# Patient Record
Sex: Male | Born: 1975
Health system: Southern US, Community
[De-identification: ages and names within clinical notes are randomized; demographics above are authoritative.]

## PROBLEM LIST (undated history)

## (undated) DIAGNOSIS — D367 Benign neoplasm of other specified sites: Secondary | ICD-10-CM

## (undated) DIAGNOSIS — M1990 Unspecified osteoarthritis, unspecified site: Secondary | ICD-10-CM

## (undated) HISTORY — PX: WISDOM TOOTH EXTRACTION: SHX21

## (undated) HISTORY — PX: COLONOSCOPY: SHX174

---

## 2006-02-01 ENCOUNTER — Ambulatory Visit: Payer: Self-pay | Admitting: Internal Medicine

## 2006-02-08 ENCOUNTER — Ambulatory Visit: Payer: Self-pay | Admitting: Critical Care Medicine

## 2007-03-20 DIAGNOSIS — J45909 Unspecified asthma, uncomplicated: Secondary | ICD-10-CM | POA: Insufficient documentation

## 2007-03-21 ENCOUNTER — Ambulatory Visit: Payer: Self-pay | Admitting: Internal Medicine

## 2008-11-20 ENCOUNTER — Ambulatory Visit: Payer: Self-pay | Admitting: Internal Medicine

## 2009-01-22 ENCOUNTER — Telehealth (INDEPENDENT_AMBULATORY_CARE_PROVIDER_SITE_OTHER): Payer: Self-pay | Admitting: *Deleted

## 2010-07-02 NOTE — Assessment & Plan Note (Signed)
[facility]                             PULMONARY OFFICE NOTE   Danny Baker, Danny Baker                       MRN:          [id number]  DATE:02/01/2006                            DOB:          [birth year]    NEW PATIENT EVALUATION   HISTORY:  This is a 35 year old white male with passive smoke exposure  only and no previous history of any respiratory complaints. 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 He says he gets sick maybe a couple of times  a year with a cold that settles into his chest, but is gone within a  week without needing any form of therapy or visiting the doctor. He says  that when he underwent his recent set of PFTs, he was in the middle of a  typical cold. He feels completely better now. He states that he can do  pretty much anything he wants to in terms of exercise including  intense aerobics. This does not appear to be affected by weather,  environmental circumstances, including the most extreme (he has served  in [employer] in [place]).   Past medical history is significant for the absence of atopy or asthma.   ALLERGIES:  None known.   MEDICATIONS:  None regularly.   SOCIAL HISTORY:  He has never smoked, but was exposed passively to his  father. He has no unusual travel, pet or occupational exposure history.   FAMILY HISTORY:  Is significant for the absence of atopy or asthma.   REVIEW OF SYSTEMS:  Taken in detail on the worksheet and significantly  negative except as outlined above.   PHYSICAL EXAMINATION:  This is a pleasant, ambulatory, white male in no  acute distress. He has stable vital signs.  HEENT: Reveals no significant turbinate edema, pallor or polyps or  cyanosis. Ear canals are clear bilaterally.  NECK: Supple without cervical adenopathy or tenderness. Trachea is  midline. No thyromegaly.  Lung fields are completely clear bilaterally to auscultation  and  percussion with excellent air movement.  There is a regular rate and rhythm without murmur, gallop or rub.  ABDOMEN: Soft, benign.  EXTREMITIES: Warm without calf tenderness, cyanosis, clubbing or edema.   Chest x-ray was reviewed, dated December 08, 2005, and is normal. PFTs  revealed variable effort on previous studies and therefore were repeated  today indicating in the effort independent section of the flow volume  loop a definite concavity with an FEV1 to FEC ratio of 65% and an  absolute FEV1 of 76%. He had a 10% improvement after bronchodilators.   IMPRESSION:  This patient most likely has subclinical asthma that has  resulted in mild airway remodeling, but has absolutely no impact in  terms of his susceptibility to extremes of environment (as evidenced by  the fact that he could serve in [place] and do extreme aerobics without  difficulty) and even during what otherwise would be typical triggers,  such as upper respiratory infections, he does not even need to see the  doctor.  Therefore, I think it would be perfectly reasonable for the patient to  wear a respirator at work and would not restrict him from doing  anything. I would use standard precautions only, nothing special, in  view of these minimally abnormal PFTs.   The patient was interested in seeing if he could normalize his lung  functions and I think it is reasonable to try Symbicort 80/4.5 two puffs  twice daily for this purpose. If this normalizes his lung function then  he probably could stay on this and/or a more cost effective alternative  such as Qvar longterm, but again given the subclinical nature of his  asthma, it is a matter of treating the lung function tests, not the  patient in this particular regard.     Charlaine Dalton. Sherene Sires, MD, [facility]  Electronically Signed    MBW/MedQ  DD: 02/01/2006  DT: 02/01/2006  Job #: [id number]

## 2010-07-02 NOTE — Assessment & Plan Note (Signed)
[facility]                             PULMONARY OFFICE NOTE   Danny Baker, Danny Baker                       MRN:          [id number]  DATE:02/08/2006                            DOB:          1975-05-02    HISTORY OF PRESENT ILLNESS:  This is a 35 year old white male patient of  Dr. 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 Sherene Sires.  PFTs revealed an FEV1 to  FEV ratio of 65% and an absolute FEV1 of 76% with 10% improvement after  bronchodilators.  The patient was started on Symbicort 80/4.5 2 puffs  twice daily.  Returns today reporting that he is tolerating the  medication well.  Patient essentially had been asymptomatic without any  wheezing, shortness of breath, decreased exercise tolerance, or  nocturnal symptoms.   PAST MEDICAL HISTORY:  Reviewed.   CURRENT MEDICATIONS:  Reviewed.   PHYSICAL EXAMINATION:  GENERAL:  Patient is a pleasant male in no acute  distress.  VITAL SIGNS:  He is afebrile with stable vital signs.  HEENT:  Unremarkable.  LUNGS:  The lung sounds are clear.  CARDIAC:  Regular rate and rhythm.  ABDOMEN:  Soft, benign.  EXTREMITIES:  Warm without any edema.   REPEAT DATA:  Repeat spirometry today revealed an FEV1 with slight  improvement at 4 liters, which went from 76% to 83% of the predicted,  and mid flow is 3.12 liters from 43% up to 62% today.   IMPRESSION/PLAN:  Mild asthma with improvement on Symbicort.  Patient  will continue on his current regimen, follow back up with Dr. Sherene Sires in 2-  3 months or sooner if needed.  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      Rubye Oaks, NP  Electronically Signed      Charlaine Dalton. Sherene Sires, MD, [facility]  Electronically Signed   TP/MedQ  DD: 02/09/2006  DT: 02/09/2006  Job #: [id number]

## 2010-11-22 ENCOUNTER — Ambulatory Visit (INDEPENDENT_AMBULATORY_CARE_PROVIDER_SITE_OTHER): Payer: 59 | Admitting: Internal Medicine

## 2010-11-22 ENCOUNTER — Encounter: Payer: Self-pay | Admitting: Internal Medicine

## 2010-11-22 VITALS — BP 114/70 | HR 63 | Temp 98.5°F | Ht 72.0 in | Wt 195.6 lb

## 2010-11-22 DIAGNOSIS — J45909 Unspecified asthma, uncomplicated: Secondary | ICD-10-CM

## 2010-11-22 MED ORDER — BUDESONIDE-FORMOTEROL FUMARATE 80-4.5 MCG/ACT IN AERO: 2.0000 | INHALATION_SPRAY | Freq: Two times a day (BID) | RESPIRATORY_TRACT | Status: DC

## 2010-11-22 NOTE — Assessment & Plan Note (Signed)
PFT's today basically wnl so All goals of chronic asthma control met including optimal function and elimination of symptoms with minimal need for rescue therapy.  Contingencies discussed in full including contacting this office immediately if not controlling the symptoms using the rule of two's.

## 2010-11-22 NOTE — Progress Notes (Signed)
Subjective:     Patient ID: Danny Baker, male   DOB: Oct 26, 1975, 35 y.o.   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 He comes in now at the request that he be taken off of Symbicort because he's had no symptoms and does not need to take it regularly. However, note that he did not have any symptoms previously of airflow obst, namely cough or dyspnea, it was just a flunking PFTs. Rec maintain on symbicort 80 2 puffs first thing in am and 2 puffs again in pm about 12 hours later   November 20, 2008 Followup asthma. Pt states that breathing has been fine. Needs refill on symbicort, not using it consistently except in am, passed physical in January of 2010 and only here for refills which "ran out am of ov".  rec maintain on symbicort 80 2bid    11/22/2010 f/u ov/Zophia Marrone cc  Off symbicort for a year with excellent ex tolerance, no problems at work wearing respirator and no need for saba or otcs with sleep or exercise.  Sleeping ok without nocturnal  or early am exacerbation  of respiratory  c/o's or need for noct saba. Also denies any obvious fluctuation of symptoms with weather or environmental changes or other aggravating or alleviating factors except as outlined above  Pt denies any significant sore throat, dyphagia, itching, sneezing, nasal congestion or excess secretions or cough, fever, chills, sweats, unintended wt loss, pleuritic or exertional cp, change in activity tolerance orthopnea pnd or leg swelling..      Allergies No Known Drug Allergies      Past Medical History:  Asthma    Family History:  negative for atopy or asthma  dm in father who drinks heavily      Review of Systems     Objective:   Physical Exam Ambulatory healthy appearing in no acute distress.  Afeb with normal vital signs wt 197 > 199 November 20, 2008 >   11/22/2010  195  HEENT: nl dentition,mod ns turmbinate edema, and nl orophanx. Nl external ear canals without cough reflex  Neck without JVD/Nodes/TM  Lungs clear to A and P bilaterally without cough on insp or exp maneuvers  RRR no s3 or murmur or increase in P2  Abd soft and benign with nl excursion in the supine position. No bruits or organomegaly  Ext warm without calf tenderness, cyanosis clubbing or edema  Skin warm and dry without lesions     Assessment:         Plan:

## 2010-11-22 NOTE — Patient Instructions (Signed)
Work on Chemical engineer technique:  relax and gently blow all the way out then take a nice smooth deep breath back in, triggering the inhaler at same time you start breathing in.  Hold for up to 5 seconds if you can.  Rinse and gargle with water when done   If your mouth or throat starts to bother you,   I suggest you time the inhaler to your dental care and after using the inhaler(s) brush teeth and tongue with a baking soda containing toothpaste and when you rinse this out, gargle with it first to see if this helps your mouth and throat.     Symbicort 80 2 puffs every 12 hours for any respiratory symptoms at all

## 2012-09-04 ENCOUNTER — Emergency Department (HOSPITAL_COMMUNITY)
Admission: EM | Admit: 2012-09-04 | Discharge: 2012-09-05 | Disposition: A | Discharge status: Home / Self Care | Payer: Worker's Compensation | Attending: Emergency Medicine | Admitting: Emergency Medicine

## 2012-09-04 DIAGNOSIS — J45909 Unspecified asthma, uncomplicated: Secondary | ICD-10-CM | POA: Insufficient documentation

## 2012-09-04 DIAGNOSIS — IMO0002 Reserved for concepts with insufficient information to code with codable children: Secondary | ICD-10-CM | POA: Insufficient documentation

## 2012-09-04 DIAGNOSIS — S39012A Strain of muscle, fascia and tendon of lower back, initial encounter: Secondary | ICD-10-CM

## 2012-09-04 DIAGNOSIS — X58XXXA Exposure to other specified factors, initial encounter: Secondary | ICD-10-CM | POA: Insufficient documentation

## 2012-09-04 DIAGNOSIS — Y929 Unspecified place or not applicable: Secondary | ICD-10-CM | POA: Insufficient documentation

## 2012-09-04 DIAGNOSIS — Y939 Activity, unspecified: Secondary | ICD-10-CM | POA: Insufficient documentation

## 2012-09-04 DIAGNOSIS — S335XXA Sprain of ligaments of lumbar spine, initial encounter: Secondary | ICD-10-CM | POA: Insufficient documentation

## 2012-09-04 MED ORDER — LORAZEPAM 1 MG PO TABS
1.0000 mg | ORAL_TABLET | Freq: Once | ORAL | Status: AC
  Administered 2012-09-04: 1 mg via ORAL
  Filled 2012-09-04: qty 1

## 2012-09-04 MED ORDER — MORPHINE SULFATE 4 MG/ML IJ SOLN
4.0000 mg | Freq: Once | INTRAMUSCULAR | Status: AC
  Administered 2012-09-04: 4 mg via INTRAVENOUS
  Filled 2012-09-04: qty 1

## 2012-09-04 NOTE — ED Provider Notes (Signed)
   History    CSN: [id number] Arrival date & time 09/04/12  2326  First MD Initiated Contact with Patient 09/04/12 2332     Chief Complaint  Patient presents with  . Back Pain   (Consider location/radiation/quality/duration/timing/severity/associated sxs/prior Treatment) The history is provided by the patient.   37 year old male has been having some back pain during the course of the day and that he was in a closed space oriented someone in a somewhat awkward position and had sudden worsening of pain in his back. Pain goes across his lower back and into both hips. His also complaining of severe muscle spasm in his back. Pain is rated 10/10. There's no weakness or numbness or tingling. He has had mild back pain in the past but nothing severe like this. He was brought in by ambulance and given ketorolac and root with no relief. Past Medical History  Diagnosis Date  . Asthma    No past surgical history on file. No family history on file. History  Substance Use Topics  . Smoking status: Never Smoker   . Smokeless tobacco: Current User    Types: Snuff  . Alcohol Use: Yes     Comment: rare    Review of Systems  All other systems reviewed and are negative.    Allergies  Review of patient's allergies indicates no known allergies.  Home Medications   Current Outpatient Rx  Name  Route  Sig  Dispense  Refill  . EXPIRED: budesonide-formoterol (SYMBICORT) 80-4.5 MCG/ACT inhaler   Inhalation   Inhale 2 puffs into the lungs 2 (two) times daily.   1 Inhaler   12    BP 160/78  Pulse 68  Temp(Src) 98.1 F (36.7 C) (Oral)  Resp 18  SpO2 100% Physical Exam  Nursing note and vitals reviewed.  37 year old male, who appears to be in pain and is very uncomfortable, but his in no acute distress. Vital signs are significant for hypertension with blood pressure 160/78. Oxygen saturation is 100%, which is normal. Head is normocephalic and atraumatic. PERRLA, EOMI. Oropharynx is  clear. Neck is nontender and supple without adenopathy or JVD. Back is moderately tender throughout the lumbar area with marked bilateral paralumbar spasm. Straight leg raise is positive at 15 on the left and 30 on the right. Lungs are clear without rales, wheezes, or rhonchi. Chest is nontender. Heart has regular rate and rhythm without murmur. Abdomen is soft, flat, nontender without masses or hepatosplenomegaly and peristalsis is normoactive. Extremities have no cyanosis or edema, full range of motion is present. Skin is warm and dry without rash. Neurologic: Mental status is normal, cranial nerves are intact, there are no motor or sensory deficits.  ED Course  Procedures (including critical care time)  1. Lumbar strain, initial encounter     MDM  Acute lumbar strain with muscle spasm. He is alert he received NSAIDs so he will be given lorazepam as a muscle relaxer and morphine for pain. There is no indication for emergent imaging today.  He got good relief from muscle spasms with initial treatment but did require additional injections of morphine to get adequate relief of pain. He is discharged with prescriptions for oxycodone-acetaminophen, naproxen, and orphenadrine.  Dione Booze, MD 09/05/12 [phone]

## 2012-09-04 NOTE — ED Notes (Signed)
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 VSS BP 140/100, Hr 72, RR 20, 18 g LAC. Pt received Toradol 30 mg IVP en route, pain 10/10

## 2012-09-05 MED ORDER — MORPHINE SULFATE 4 MG/ML IJ SOLN
4.0000 mg | Freq: Once | INTRAMUSCULAR | Status: AC
  Administered 2012-09-05: 4 mg via INTRAVENOUS
  Filled 2012-09-05: qty 1

## 2012-09-05 MED ORDER — NAPROXEN 500 MG PO TABS: 500.0000 mg | ORAL_TABLET | Freq: Two times a day (BID) | ORAL | Status: AC

## 2012-09-05 MED ORDER — OXYCODONE-ACETAMINOPHEN 5-325 MG PO TABS: 1.0000 | ORAL_TABLET | ORAL | Status: DC | PRN

## 2012-09-05 MED ORDER — ORPHENADRINE CITRATE ER 100 MG PO TB12: 100.0000 mg | ORAL_TABLET | Freq: Two times a day (BID) | ORAL | Status: DC

## 2012-09-12 ENCOUNTER — Other Ambulatory Visit: Payer: Self-pay | Admitting: Family Medicine

## 2012-09-12 DIAGNOSIS — M545 Low back pain: Secondary | ICD-10-CM

## 2012-09-24 ENCOUNTER — Other Ambulatory Visit: Payer: Self-pay

## 2012-10-17 ENCOUNTER — Ambulatory Visit
Admission: RE | Admit: 2012-10-17 | Discharge: 2012-10-17 | Disposition: A | Discharge status: Home / Self Care | Payer: Worker's Compensation | Source: Ambulatory Visit | Attending: Family | Admitting: Family

## 2012-10-17 ENCOUNTER — Other Ambulatory Visit: Payer: Self-pay | Admitting: Family

## 2012-10-17 DIAGNOSIS — M545 Low back pain: Secondary | ICD-10-CM

## 2014-08-20 IMAGING — CR DG LUMBAR SPINE COMPLETE 4+V
5 series · 5 of 5 positions shown · non-contrast
Comparison: None.

CLINICAL DATA: Low back pain with radicular symptoms

LUMBAR SPINE - COMPLETE 4+ VIEW

[view not recorded (1 of 5)]
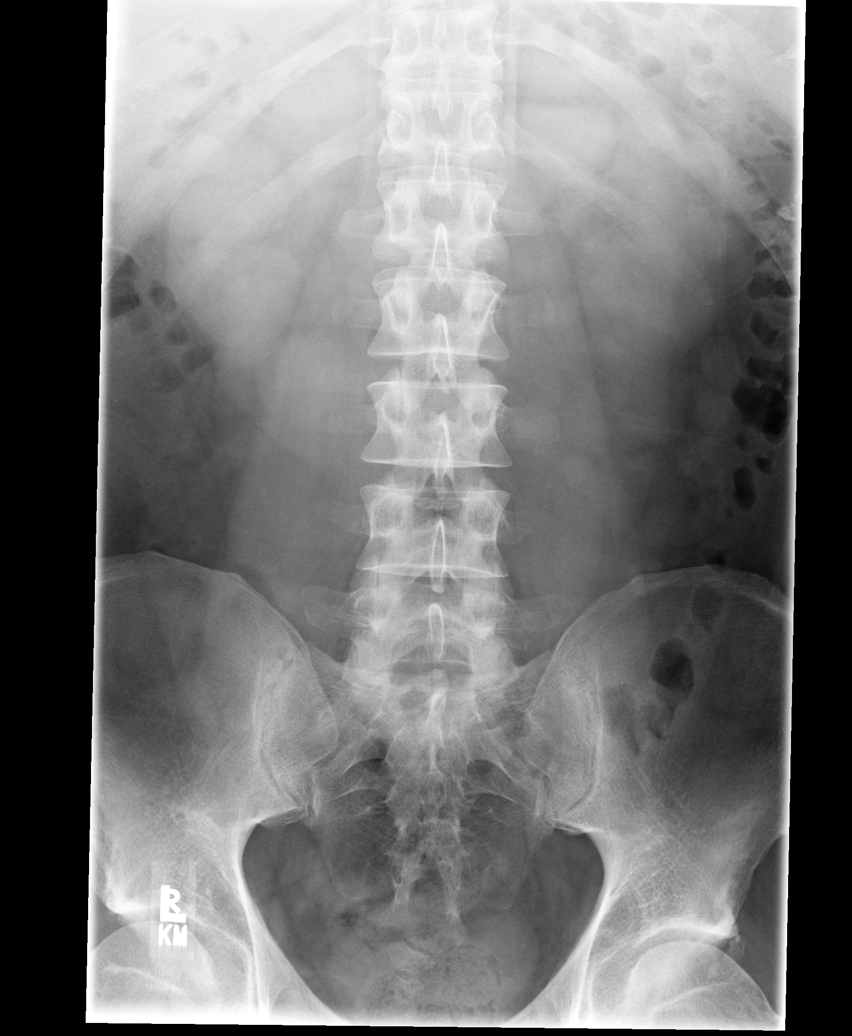

[view not recorded (2 of 5)]
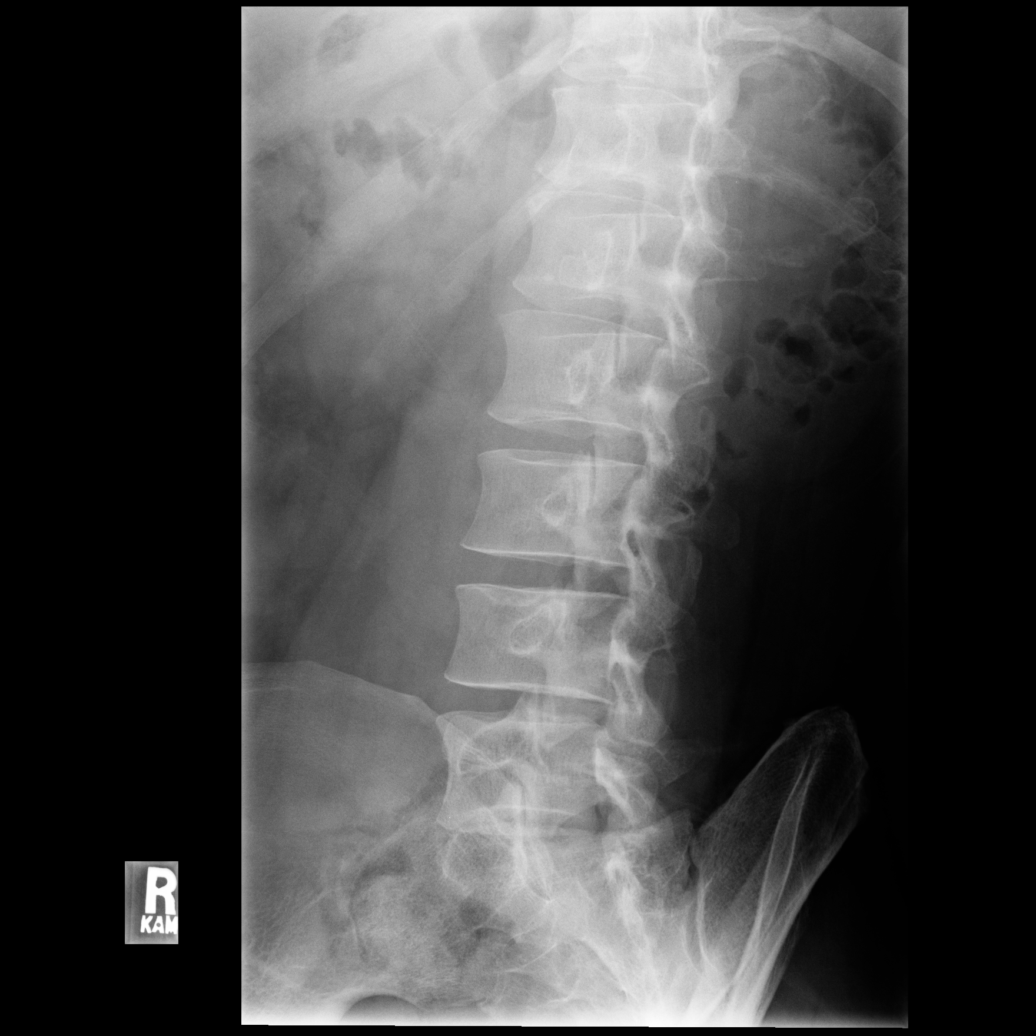

[view not recorded (3 of 5)]
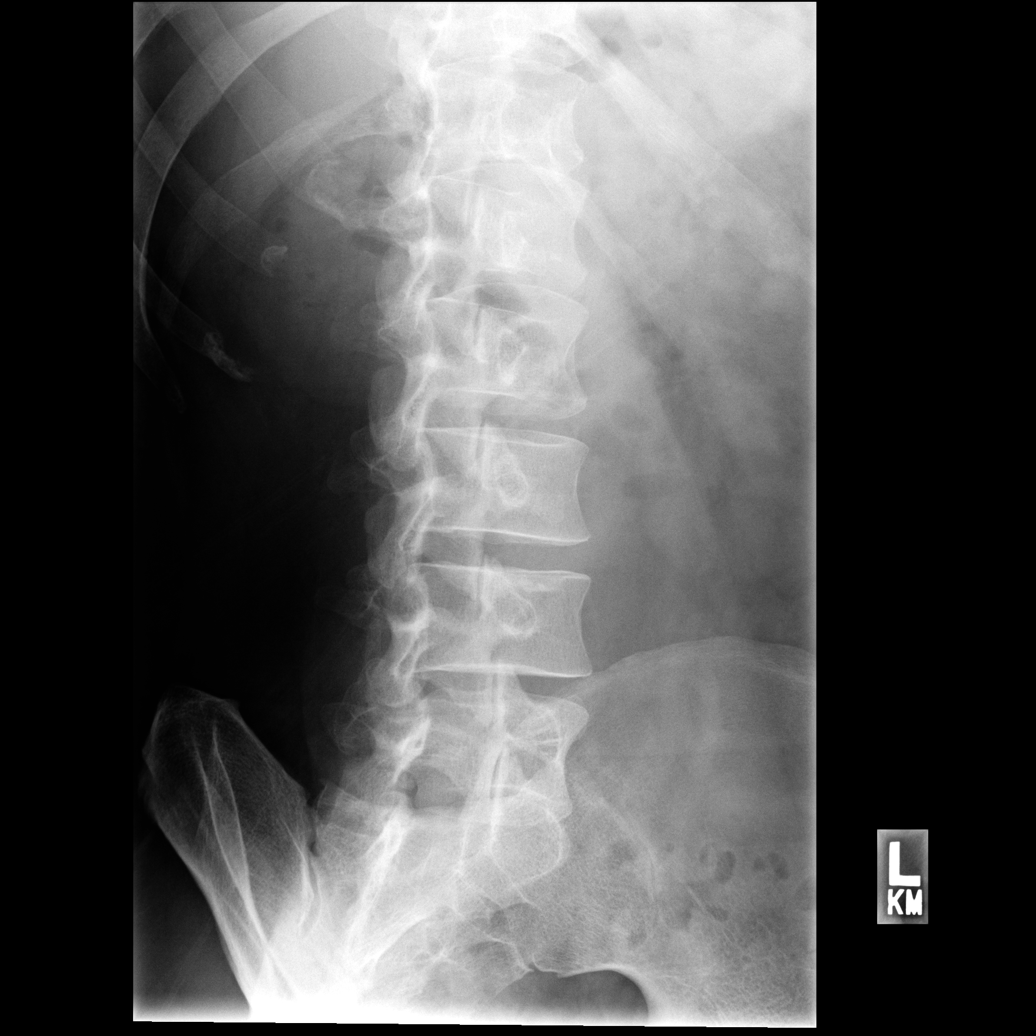

[view not recorded (4 of 5)]
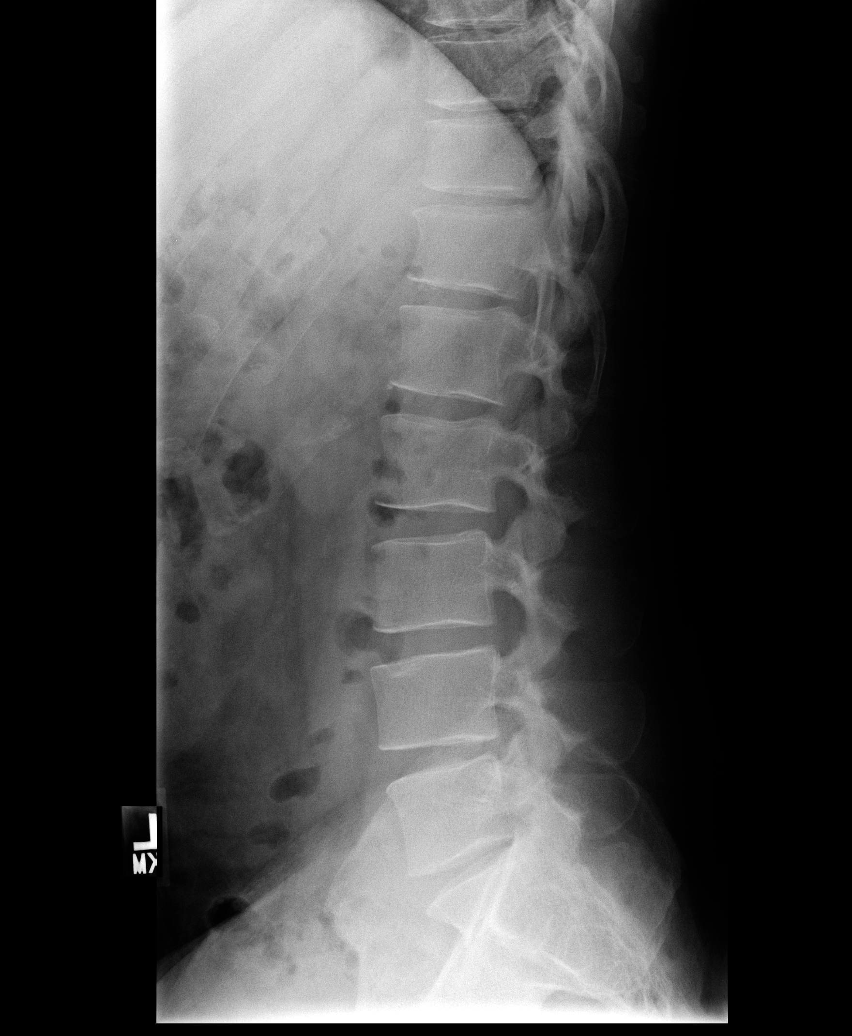

[view not recorded (5 of 5)]
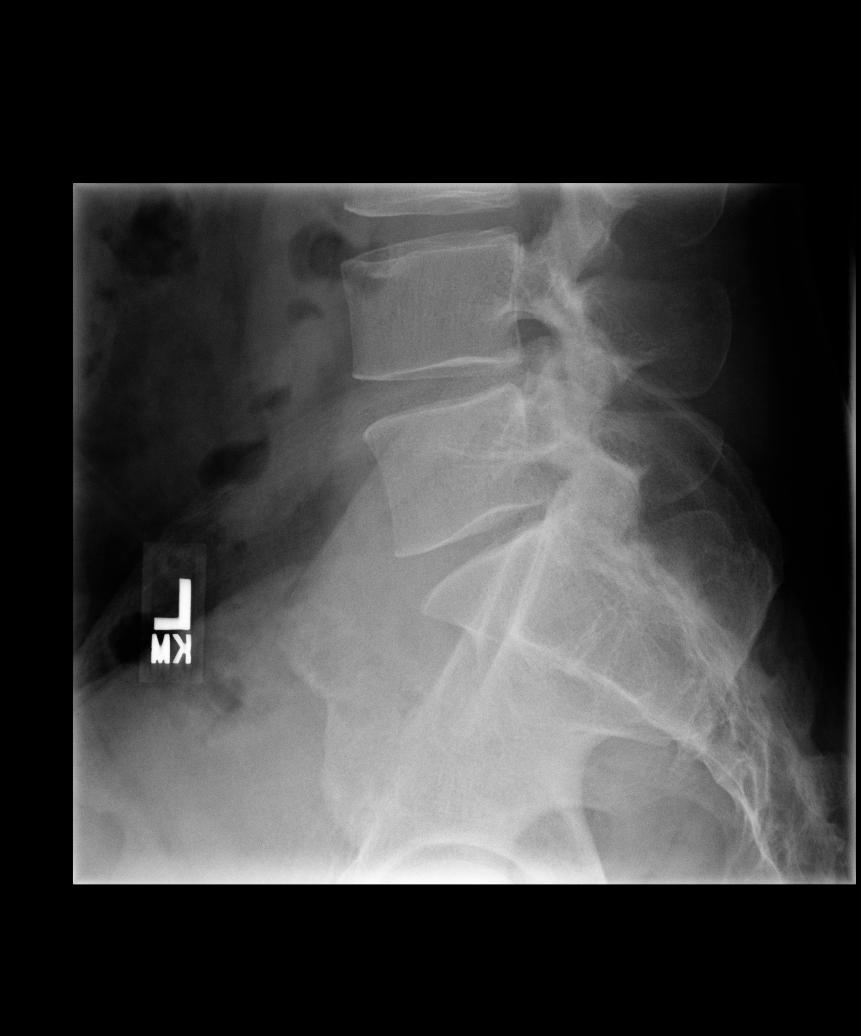

[5 of 5 positions shown; findings below may reference images not displayed]

FINDINGS: Frontal, lateral, spot lumbosacral lateral, and bilateral
oblique views were obtained.  There are five non-rib bearing lumbar
type vertebral bodies.  There is no fracture or spondylolisthesis.
There is slight disc space narrowing at L4-5.  Other disc spaces
appear normal.  There is mild facet osteoarthritic change at L5-S1
bilaterally.  No erosive change.
IMPRESSION: Slight osteoarthritic change.  No fracture or spondylolisthesis.

## 2016-08-18 DIAGNOSIS — R0602 Shortness of breath: Secondary | ICD-10-CM | POA: Diagnosis not present

## 2016-09-06 DIAGNOSIS — J392 Other diseases of pharynx: Secondary | ICD-10-CM | POA: Diagnosis not present

## 2016-12-19 DIAGNOSIS — Z800 Family history of malignant neoplasm of digestive organs: Secondary | ICD-10-CM | POA: Diagnosis not present

## 2016-12-19 DIAGNOSIS — M545 Low back pain: Secondary | ICD-10-CM | POA: Diagnosis not present

## 2016-12-19 DIAGNOSIS — Z131 Encounter for screening for diabetes mellitus: Secondary | ICD-10-CM | POA: Diagnosis not present

## 2016-12-19 DIAGNOSIS — Z136 Encounter for screening for cardiovascular disorders: Secondary | ICD-10-CM | POA: Diagnosis not present

## 2016-12-19 DIAGNOSIS — Z0000 Encounter for general adult medical examination without abnormal findings: Secondary | ICD-10-CM | POA: Diagnosis not present

## 2016-12-28 DIAGNOSIS — Z800 Family history of malignant neoplasm of digestive organs: Secondary | ICD-10-CM | POA: Diagnosis not present

## 2017-01-12 DIAGNOSIS — Z800 Family history of malignant neoplasm of digestive organs: Secondary | ICD-10-CM | POA: Diagnosis not present

## 2017-01-12 DIAGNOSIS — D126 Benign neoplasm of colon, unspecified: Secondary | ICD-10-CM | POA: Diagnosis not present

## 2017-01-12 DIAGNOSIS — Z1211 Encounter for screening for malignant neoplasm of colon: Secondary | ICD-10-CM | POA: Diagnosis not present

## 2017-06-21 DIAGNOSIS — E7800 Pure hypercholesterolemia, unspecified: Secondary | ICD-10-CM | POA: Diagnosis not present

## 2017-06-21 DIAGNOSIS — M792 Neuralgia and neuritis, unspecified: Secondary | ICD-10-CM | POA: Diagnosis not present

## 2017-06-21 DIAGNOSIS — L723 Sebaceous cyst: Secondary | ICD-10-CM | POA: Diagnosis not present

## 2017-07-04 ENCOUNTER — Other Ambulatory Visit: Payer: Self-pay | Admitting: Surgery

## 2017-07-04 DIAGNOSIS — L089 Local infection of the skin and subcutaneous tissue, unspecified: Secondary | ICD-10-CM | POA: Diagnosis not present

## 2017-07-04 DIAGNOSIS — L723 Sebaceous cyst: Secondary | ICD-10-CM | POA: Diagnosis not present

## 2017-08-04 ENCOUNTER — Other Ambulatory Visit: Payer: Self-pay | Admitting: Surgery

## 2017-08-04 DIAGNOSIS — L723 Sebaceous cyst: Secondary | ICD-10-CM | POA: Diagnosis not present

## 2017-08-04 DIAGNOSIS — L089 Local infection of the skin and subcutaneous tissue, unspecified: Secondary | ICD-10-CM | POA: Diagnosis not present

## 2017-08-09 ENCOUNTER — Encounter (HOSPITAL_BASED_OUTPATIENT_CLINIC_OR_DEPARTMENT_OTHER): Payer: Self-pay | Admitting: *Deleted

## 2017-08-14 ENCOUNTER — Other Ambulatory Visit: Payer: Self-pay

## 2017-08-14 ENCOUNTER — Encounter (HOSPITAL_BASED_OUTPATIENT_CLINIC_OR_DEPARTMENT_OTHER): Payer: Self-pay | Admitting: *Deleted

## 2017-08-15 NOTE — H&P (Signed)
  Danny Baker [name] Documented: 08/04/2017 11:14 AM Location: [facility] Patient #: [id number] DOB: Mar 12, 1975 Married / Language: Danny Baker / Race: White Male   History of Present Illness (Quantay Zaremba A. Ninfa Linden MD; 08/04/2017 11:39 AM) The patient is a 42 year old male who presents with skin lesions. He is here today for another evaluation of the chronically infected sebaceous cyst on his right posterior neck. He reports he still had intermittent drainage from the cyst for about a week and then finally quit with antibiotics. He still has some discomfort at the cyst site is otherwise doing well.   Allergies Malachi Bonds, CMA; 08/04/2017 11:14 AM) No Known Drug Allergies [07/04/2017]:  Medication History (Malachi Bonds, CMA; 08/04/2017 11:14 AM) Naproxen (500MG  Tablet, Oral) Active. Medications Reconciled Past Surgical History Malachi Bonds, CMA; 07/04/2017 4:22 PM) Colon Polyp Removal - Colonoscopy  Oral Surgery   Diagnostic Studies History Malachi Bonds, CMA; 07/04/2017 4:22 PM) Colonoscopy  within last year  Allergies Malachi Bonds, CMA; 07/04/2017 4:23 PM) No Known Drug Allergies [07/04/2017]:  Medication History Malachi Bonds, CMA; 07/04/2017 4:23 PM) Naproxen (500MG  Tablet, Oral) Active. Medications Reconciled  Social History Malachi Bonds, CMA; 07/04/2017 4:22 PM) Alcohol use  Occasional alcohol use. Caffeine use  Carbonated beverages, Tea. No drug use  Tobacco use  Never smoker.  Family History Malachi Bonds, CMA; 07/04/2017 4:22 PM) Colon Cancer  Mother. Diabetes Mellitus  Father. Hypertension  Father. Respiratory Condition  Father.  Other Problems Malachi Bonds, CMA; 07/04/2017 4:22 PM) Back Pain  Vitals (Chemira Jones CMA; 08/04/2017 11:14 AM) 08/04/2017 11:14 AM Weight: 187.2 lb Height: 72in Body Surface Area: 2.07 m Body Mass Index: 25.39 kg/m  Temp.: 97.41F(Oral)  Pulse: 98 (Regular)  BP: 130/82 (Sitting, Left Arm,  Standard)       Physical Exam (Rishab Stoudt A. Ninfa Linden MD; 08/04/2017 11:39 AM) The physical exam findings are as follows: Note:On exam, he still has the 2 cm sebaceous cyst on the right posterior neck. There is no erythema but it is mildly tender    Assessment & Plan (Viveka Wilmeth A. Ninfa Linden MD; 08/04/2017 11:40 AM) INFECTED SEBACEOUS CYST (L72.3) Impression: At this point, complete excision of the chronically infected sebaceous cyst is recommended to prevent ongoing infections. I discussed this with him in detail. We discussed the risks of the procedure. We also discussed continued conservative management. He wishes to proceed with surgery. We discussed postoperative recovery. He understands and surgery will be scheduled Current Plans Restarted Doxycycline Hyclate 100 MG Oral Capsule, 1 (one) Capsule two times daily, 20 days starting 08/04/2017, No Refill.

## 2017-08-15 NOTE — Progress Notes (Signed)
Pt given ensure as well as instructions to drink by 1130 DOS. Marland Kitchen Pt verbalized understanding

## 2017-08-16 ENCOUNTER — Ambulatory Visit (HOSPITAL_BASED_OUTPATIENT_CLINIC_OR_DEPARTMENT_OTHER): Payer: 59 | Admitting: Certified Registered"

## 2017-08-16 ENCOUNTER — Encounter (HOSPITAL_BASED_OUTPATIENT_CLINIC_OR_DEPARTMENT_OTHER)
Admission: RE | Disposition: A | Discharge status: Home / Self Care | Payer: Self-pay | Source: Ambulatory Visit | Attending: Surgery

## 2017-08-16 ENCOUNTER — Other Ambulatory Visit: Payer: Self-pay

## 2017-08-16 ENCOUNTER — Ambulatory Visit (HOSPITAL_BASED_OUTPATIENT_CLINIC_OR_DEPARTMENT_OTHER)
Admission: RE | Admit: 2017-08-16 | Discharge: 2017-08-16 | Disposition: A | Discharge status: Home / Self Care | Payer: 59 | Source: Ambulatory Visit | Attending: Surgery | Admitting: Surgery

## 2017-08-16 DIAGNOSIS — L089 Local infection of the skin and subcutaneous tissue, unspecified: Secondary | ICD-10-CM | POA: Diagnosis not present

## 2017-08-16 DIAGNOSIS — L723 Sebaceous cyst: Secondary | ICD-10-CM | POA: Insufficient documentation

## 2017-08-16 DIAGNOSIS — J45909 Unspecified asthma, uncomplicated: Secondary | ICD-10-CM | POA: Diagnosis not present

## 2017-08-16 DIAGNOSIS — L0889 Other specified local infections of the skin and subcutaneous tissue: Secondary | ICD-10-CM | POA: Diagnosis not present

## 2017-08-16 HISTORY — DX: Unspecified osteoarthritis, unspecified site: M19.90

## 2017-08-16 HISTORY — DX: Benign neoplasm of other specified sites: D36.7

## 2017-08-16 HISTORY — PX: EXCISION MASS NECK: SHX6703

## 2017-08-16 SURGERY — EXCISION, MASS, NECK
Anesthesia: Monitor Anesthesia Care | Site: Neck | Laterality: Right

## 2017-08-16 MED ORDER — SCOPOLAMINE 1 MG/3DAYS TD PT72: 1.0000 | MEDICATED_PATCH | Freq: Once | TRANSDERMAL | Status: DC | PRN

## 2017-08-16 MED ORDER — CEFAZOLIN SODIUM-DEXTROSE 2-4 GM/100ML-% IV SOLN: 2.0000 g | INTRAVENOUS | Status: DC

## 2017-08-16 MED ORDER — ONDANSETRON HCL 4 MG/2ML IJ SOLN
INTRAMUSCULAR | Status: AC
  Filled 2017-08-16: qty 2

## 2017-08-16 MED ORDER — CELECOXIB 200 MG PO CAPS
200.0000 mg | ORAL_CAPSULE | ORAL | Status: AC
  Administered 2017-08-16: 200 mg via ORAL

## 2017-08-16 MED ORDER — GABAPENTIN 300 MG PO CAPS
300.0000 mg | ORAL_CAPSULE | ORAL | Status: AC
  Administered 2017-08-16: 300 mg via ORAL

## 2017-08-16 MED ORDER — PROPOFOL 500 MG/50ML IV EMUL
INTRAVENOUS | Status: DC | PRN
  Administered 2017-08-16: 200 ug/kg/min via INTRAVENOUS

## 2017-08-16 MED ORDER — FENTANYL CITRATE (PF) 100 MCG/2ML IJ SOLN
INTRAMUSCULAR | Status: AC
  Filled 2017-08-16: qty 2

## 2017-08-16 MED ORDER — CHLORHEXIDINE GLUCONATE CLOTH 2 % EX PADS: 6.0000 | MEDICATED_PAD | Freq: Once | CUTANEOUS | Status: DC

## 2017-08-16 MED ORDER — ACETAMINOPHEN 500 MG PO TABS
ORAL_TABLET | ORAL | Status: AC
  Filled 2017-08-16: qty 2

## 2017-08-16 MED ORDER — OXYCODONE HCL 5 MG PO TABS: 5.0000 mg | ORAL_TABLET | Freq: Four times a day (QID) | ORAL | 0 refills | Status: AC | PRN

## 2017-08-16 MED ORDER — FENTANYL CITRATE (PF) 100 MCG/2ML IJ SOLN
50.0000 ug | INTRAMUSCULAR | Status: AC | PRN
  Administered 2017-08-16: 50 ug via INTRAVENOUS
  Administered 2017-08-16 (×2): 25 ug via INTRAVENOUS

## 2017-08-16 MED ORDER — LIDOCAINE HCL (PF) 1 % IJ SOLN
INTRAMUSCULAR | Status: DC | PRN
  Administered 2017-08-16: 10 mL

## 2017-08-16 MED ORDER — ONDANSETRON HCL 4 MG/2ML IJ SOLN
INTRAMUSCULAR | Status: DC | PRN
  Administered 2017-08-16: 4 mg via INTRAVENOUS

## 2017-08-16 MED ORDER — MEPERIDINE HCL 25 MG/ML IJ SOLN: 6.2500 mg | INTRAMUSCULAR | Status: DC | PRN

## 2017-08-16 MED ORDER — KETOROLAC TROMETHAMINE 30 MG/ML IJ SOLN: 30.0000 mg | Freq: Once | INTRAMUSCULAR | Status: DC | PRN

## 2017-08-16 MED ORDER — MIDAZOLAM HCL 2 MG/2ML IJ SOLN
1.0000 mg | INTRAMUSCULAR | Status: DC | PRN
  Administered 2017-08-16: 2 mg via INTRAVENOUS

## 2017-08-16 MED ORDER — OXYCODONE HCL 5 MG PO TABS: 5.0000 mg | ORAL_TABLET | Freq: Once | ORAL | Status: DC | PRN

## 2017-08-16 MED ORDER — PROMETHAZINE HCL 25 MG/ML IJ SOLN: 6.2500 mg | INTRAMUSCULAR | Status: DC | PRN

## 2017-08-16 MED ORDER — GABAPENTIN 300 MG PO CAPS
ORAL_CAPSULE | ORAL | Status: AC
  Filled 2017-08-16: qty 1

## 2017-08-16 MED ORDER — FENTANYL CITRATE (PF) 100 MCG/2ML IJ SOLN: 25.0000 ug | INTRAMUSCULAR | Status: DC | PRN

## 2017-08-16 MED ORDER — LACTATED RINGERS IV SOLN
INTRAVENOUS | Status: DC
  Administered 2017-08-16: 14:00:00 via INTRAVENOUS

## 2017-08-16 MED ORDER — OXYCODONE HCL 5 MG/5ML PO SOLN: 5.0000 mg | Freq: Once | ORAL | Status: DC | PRN

## 2017-08-16 MED ORDER — DEXAMETHASONE SODIUM PHOSPHATE 10 MG/ML IJ SOLN
INTRAMUSCULAR | Status: AC
  Filled 2017-08-16: qty 1

## 2017-08-16 MED ORDER — PROPOFOL 10 MG/ML IV BOLUS
INTRAVENOUS | Status: AC
  Filled 2017-08-16: qty 20

## 2017-08-16 MED ORDER — MIDAZOLAM HCL 2 MG/2ML IJ SOLN
INTRAMUSCULAR | Status: AC
  Filled 2017-08-16: qty 2

## 2017-08-16 MED ORDER — CELECOXIB 200 MG PO CAPS
ORAL_CAPSULE | ORAL | Status: AC
  Filled 2017-08-16: qty 1

## 2017-08-16 MED ORDER — CEFAZOLIN SODIUM-DEXTROSE 2-3 GM-%(50ML) IV SOLR
INTRAVENOUS | Status: DC | PRN
  Administered 2017-08-16: 2 g via INTRAVENOUS

## 2017-08-16 MED ORDER — ACETAMINOPHEN 500 MG PO TABS
1000.0000 mg | ORAL_TABLET | ORAL | Status: AC
  Administered 2017-08-16: 1000 mg via ORAL

## 2017-08-16 SURGICAL SUPPLY — 45 items
ADH SKN CLS APL DERMABOND .7 (GAUZE/BANDAGES/DRESSINGS) ×1
BLADE CLIPPER SURG (BLADE) IMPLANT
BLADE HEX COATED 2.75 (ELECTRODE) ×3 IMPLANT
BLADE SURG 15 STRL LF DISP TIS (BLADE) ×1 IMPLANT
BLADE SURG 15 STRL SS (BLADE) ×3
CANISTER SUCT 1200ML W/VALVE (MISCELLANEOUS) IMPLANT
CHLORAPREP W/TINT 26ML (MISCELLANEOUS) ×3 IMPLANT
COVER BACK TABLE 60X90IN (DRAPES) ×3 IMPLANT
COVER MAYO STAND STRL (DRAPES) ×3 IMPLANT
DECANTER SPIKE VIAL GLASS SM (MISCELLANEOUS) IMPLANT
DERMABOND ADVANCED (GAUZE/BANDAGES/DRESSINGS) ×2
DERMABOND ADVANCED .7 DNX12 (GAUZE/BANDAGES/DRESSINGS) ×2 IMPLANT
DRAPE LAPAROTOMY 100X72 PEDS (DRAPES) ×3 IMPLANT
DRAPE UTILITY XL STRL (DRAPES) ×3 IMPLANT
ELECT REM PT RETURN 9FT ADLT (ELECTROSURGICAL) ×3
ELECTRODE REM PT RTRN 9FT ADLT (ELECTROSURGICAL) ×1 IMPLANT
GLOVE BIOGEL PI IND STRL 7.0 (GLOVE) IMPLANT
GLOVE BIOGEL PI INDICATOR 7.0 (GLOVE) ×2
GLOVE ECLIPSE 6.5 STRL STRAW (GLOVE) ×2 IMPLANT
GLOVE SURG SIGNA 7.5 PF LTX (GLOVE) ×3 IMPLANT
GOWN STRL REUS W/ TWL LRG LVL3 (GOWN DISPOSABLE) ×1 IMPLANT
GOWN STRL REUS W/ TWL XL LVL3 (GOWN DISPOSABLE) ×1 IMPLANT
GOWN STRL REUS W/TWL LRG LVL3 (GOWN DISPOSABLE) ×3
GOWN STRL REUS W/TWL XL LVL3 (GOWN DISPOSABLE) ×3
NDL HYPO 25X1 1.5 SAFETY (NEEDLE) ×1 IMPLANT
NEEDLE HYPO 25X1 1.5 SAFETY (NEEDLE) ×3 IMPLANT
NS IRRIG 1000ML POUR BTL (IV SOLUTION) IMPLANT
PACK BASIN DAY SURGERY FS (CUSTOM PROCEDURE TRAY) ×3 IMPLANT
PENCIL BUTTON HOLSTER BLD 10FT (ELECTRODE) ×3 IMPLANT
SLEEVE SCD COMPRESS KNEE MED (MISCELLANEOUS) IMPLANT
SPONGE LAP 4X18 RFD (DISPOSABLE) ×3 IMPLANT
SUT MNCRL AB 4-0 PS2 18 (SUTURE) IMPLANT
SUT VIC AB 2-0 SH 27 (SUTURE)
SUT VIC AB 2-0 SH 27XBRD (SUTURE) IMPLANT
SUT VIC AB 3-0 SH 27 (SUTURE) ×6
SUT VIC AB 3-0 SH 27X BRD (SUTURE) IMPLANT
SWAB COLLECTION DEVICE MRSA (MISCELLANEOUS) IMPLANT
SWAB CULTURE ESWAB REG 1ML (MISCELLANEOUS) IMPLANT
SYR BULB 3OZ (MISCELLANEOUS) IMPLANT
SYR CONTROL 10ML LL (SYRINGE) ×3 IMPLANT
TOWEL GREEN STERILE FF (TOWEL DISPOSABLE) ×3 IMPLANT
TOWEL OR NON WOVEN STRL DISP B (DISPOSABLE) ×3 IMPLANT
TUBE CONNECTING 20'X1/4 (TUBING) ×1
TUBE CONNECTING 20X1/4 (TUBING) ×1 IMPLANT
YANKAUER SUCT BULB TIP NO VENT (SUCTIONS) ×2 IMPLANT

## 2017-08-16 NOTE — Transfer of Care (Signed)
Immediate Anesthesia Transfer of Care Note  Patient: Danny Baker  Procedure(s) Performed: EXCISION OF RIGHT POSTERIOR NECK CHRONICALLY INFECTED CYST ERAS PATHWAY (Right Neck)  Patient Location: PACU  Anesthesia Type:MAC  Level of Consciousness: awake, alert  and oriented  Airway & Oxygen Therapy: Patient Spontanous Breathing and Patient connected to face mask oxygen  Post-op Assessment: Report given to RN and Post -op Vital signs reviewed and stable  Post vital signs: Reviewed and stable  Last Vitals:  Vitals Value Taken Time  BP 113/72 08/16/2017  2:41 PM  Temp    Pulse 56 08/16/2017  2:43 PM  Resp 17 08/16/2017  2:43 PM  SpO2 98 % 08/16/2017  2:43 PM  Vitals shown include unvalidated device data.  Last Pain:  Vitals:   08/16/17 1326  TempSrc: Oral         Complications: No apparent anesthesia complications

## 2017-08-16 NOTE — Interval H&P Note (Signed)
History and Physical Interval Note: no change in H and P  08/16/2017 1:12 PM  Danny Baker  has presented today for surgery, with the diagnosis of Chronically infected sebaceous cyst  The various methods of treatment have been discussed with the patient and family. After consideration of risks, benefits and other options for treatment, the patient has consented to  Procedure(s): EXCISION OF RIGHT POSTERIOR NECK CHRONICALLY INFECTED CYST ERAS PATHWAY (Right) as a surgical intervention .  The patient's history has been reviewed, patient examined, no change in status, stable for surgery.  I have reviewed the patient's chart and labs.  Questions were answered to the patient's satisfaction.     Danny Baker A

## 2017-08-16 NOTE — Op Note (Signed)
EXCISION OF RIGHT POSTERIOR NECK CHRONICALLY INFECTED CYST ERAS PATHWAY  Procedure Note  Danny Baker 08/16/2017   Pre-op Diagnosis: Chronically infected sebaceous cyst     Post-op Diagnosis: same  Procedure(s): Excision of chronically infected right posterior neck sebaceous cyst  ( 3 cm )  Surgeon(s): Coralie Keens, MD  Anesthesia: Choice  Staff:  Circulator: Antionette Poles, RN Scrub Person: Faythe Dingwall, RN  Estimated Blood Loss: Minimal               Specimens: sent to path  Procedure: The patient was brought to the operating room and identified as correct patient.  He was placed upon the operating table and anesthesia was induced.  We turned his head to the left.  We then prepped his posterior lateral neck on the right side with Betadine.  I anesthetized the skin over the visible cyst with lidocaine.  I then made an elliptical incision with Baker scalpel to excise the overlying skin.  I then took this down to the subcutaneous tissue with the scalpel.  There was Baker lot of inflammatory tissue and granulation tissue which I excised with the scalpel in the area.  This was all sent to pathology for evaluation.  I then achieved hemostasis with the cautery.  I then closed the incision in layers of 3-0 Vicryl suture and 4-0 Monocryl.  Dermabond was then applied.  The patient tolerated procedure well.  All the counts were correct at the end of procedure.  The patient was then taken in stable condition to the recovery room.          Danny Baker   Date: 08/16/2017  Time: 2:37 PM

## 2017-08-16 NOTE — Discharge Instructions (Signed)
°  Post Anesthesia Home Care Instructions  Activity: Get plenty of rest for the remainder of the day. A responsible individual must stay with you for 24 hours following the procedure.  For the next 24 hours, DO NOT: -Drive a car -Paediatric nurse -Drink alcoholic beverages -Take any medication unless instructed by your physician -Make any legal decisions or sign important papers.  Meals: Start with liquid foods such as gelatin or soup. Progress to regular foods as tolerated. Avoid greasy, spicy, heavy foods. If nausea and/or vomiting occur, drink only clear liquids until the nausea and/or vomiting subsides. Call your physician if vomiting continues.  Special Instructions/Symptoms: Your throat may feel dry or sore from the anesthesia or the breathing tube placed in your throat during surgery. If this causes discomfort, gargle with warm salt water. The discomfort should disappear within 24 hours.  If you had a scopolamine patch placed behind your ear for the management of post- operative nausea and/or vomiting:  1. The medication in the patch is effective for 72 hours, after which it should be removed.  Wrap patch in a tissue and discard in the trash. Wash hands thoroughly with soap and water. 2. You may remove the patch earlier than 72 hours if you experience unpleasant side effects which may include dry mouth, dizziness or visual disturbances. 3. Avoid touching the patch. Wash your hands with soap and water after contact with the patch.      Call your surgeon if you experience:   1.  Fever over 101.0. 2.  Inability to urinate. 3.  Nausea and/or vomiting. 4.  Extreme swelling or bruising at the surgical site. 5.  Continued bleeding from the incision. 6.  Increased pain, redness or drainage from the incision. 7.  Problems related to your pain medication. 8.  Any problems and/or concerns   Ok to shower starting tomorrow  Ice pack, Tylenol, ibuprofen also for pain  Try to avoid  vigorous activity for 1 week.

## 2017-08-16 NOTE — Anesthesia Preprocedure Evaluation (Signed)
Anesthesia Evaluation  Patient identified by MRN, date of birth, ID band Patient awake    Reviewed: Allergy & Precautions, NPO status , Patient's Chart, lab work & pertinent test results  Airway Mallampati: I  TM Distance: >3 FB Neck ROM: Full    Dental no notable dental hx.    Pulmonary neg pulmonary ROS,    Pulmonary exam normal breath sounds clear to auscultation       Cardiovascular negative cardio ROS Normal cardiovascular exam Rhythm:Regular Rate:Normal     Neuro/Psych negative neurological ROS  negative psych ROS   GI/Hepatic negative GI ROS, Neg liver ROS,   Endo/Other  negative endocrine ROS  Renal/GU negative Renal ROS     Musculoskeletal negative musculoskeletal ROS (+)   Abdominal   Peds  Hematology negative hematology ROS (+)   Anesthesia Other Findings   Reproductive/Obstetrics                             Anesthesia Physical Anesthesia Plan  ASA: II  Anesthesia Plan: MAC   Post-op Pain Management:    Induction: Intravenous  PONV Risk Score and Plan: Propofol infusion and Ondansetron  Airway Management Planned:   Additional Equipment:   Intra-op Plan:   Post-operative Plan:   Informed Consent: I have reviewed the patients History and Physical, chart, labs and discussed the procedure including the risks, benefits and alternatives for the proposed anesthesia with the patient or authorized representative who has indicated his/her understanding and acceptance.   Dental advisory given  Plan Discussed with: CRNA  Anesthesia Plan Comments:         Anesthesia Quick Evaluation

## 2017-08-18 ENCOUNTER — Encounter (HOSPITAL_BASED_OUTPATIENT_CLINIC_OR_DEPARTMENT_OTHER): Payer: Self-pay | Admitting: Surgery

## 2017-08-18 NOTE — Anesthesia Postprocedure Evaluation (Signed)
Anesthesia Post Note  Patient: Danny Baker  Procedure(s) Performed: EXCISION OF RIGHT POSTERIOR NECK CHRONICALLY INFECTED CYST ERAS PATHWAY (Right Neck)     Patient location during evaluation: PACU Anesthesia Type: MAC Level of consciousness: awake and alert Pain management: pain level controlled Vital Signs Assessment: post-procedure vital signs reviewed and stable Respiratory status: spontaneous breathing Cardiovascular status: stable Anesthetic complications: no    Last Vitals:  Vitals:   08/16/17 1500 08/16/17 1534  BP: 112/80 122/89  Pulse: (!) 46 (!) 50  Resp: 13 16  Temp:  36.4 C  SpO2: 99% 100%    Last Pain:  Vitals:   08/16/17 1534  TempSrc: Oral  PainSc: 0-No pain                 Nolon Nations

## 2017-12-22 DIAGNOSIS — Z13220 Encounter for screening for lipoid disorders: Secondary | ICD-10-CM | POA: Diagnosis not present

## 2017-12-22 DIAGNOSIS — Z0000 Encounter for general adult medical examination without abnormal findings: Secondary | ICD-10-CM | POA: Diagnosis not present

## 2020-07-03 ENCOUNTER — Other Ambulatory Visit: Payer: Self-pay | Admitting: Family Medicine

## 2020-07-03 DIAGNOSIS — M542 Cervicalgia: Secondary | ICD-10-CM

## 2020-07-10 ENCOUNTER — Ambulatory Visit
Admission: RE | Admit: 2020-07-10 | Discharge: 2020-07-10 | Disposition: A | Discharge status: Home / Self Care | Payer: 59 | Source: Ambulatory Visit | Attending: Family Medicine | Admitting: Family Medicine

## 2020-07-10 ENCOUNTER — Other Ambulatory Visit: Payer: Self-pay

## 2020-07-10 DIAGNOSIS — M542 Cervicalgia: Secondary | ICD-10-CM

## 2022-05-13 IMAGING — MR MR CERVICAL SPINE W/O CM
4 series · 43 of 48 positions shown · non-contrast
Comparison: None.

CLINICAL DATA: Neck pain radiating to left upper extremity.
Numbness and tingling in left arm. No known injury.

EXAM:
MRI CERVICAL SPINE WITHOUT CONTRAST
TECHNIQUE: Multiplanar, multisequence MR imaging of the cervical spine was
performed. No intravenous contrast was administered.

[Series 2: T2 · sagittal · 3.0mm · 0.41mm/px · 9 of 17 slices shown (1 of 2)]
[im 1/17]
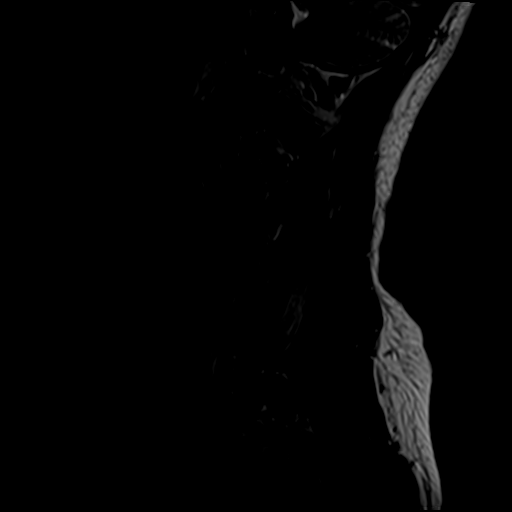
[im 3/17]
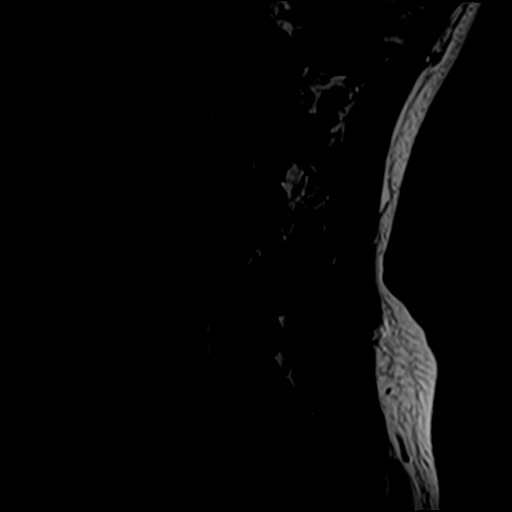
[im 5/17]
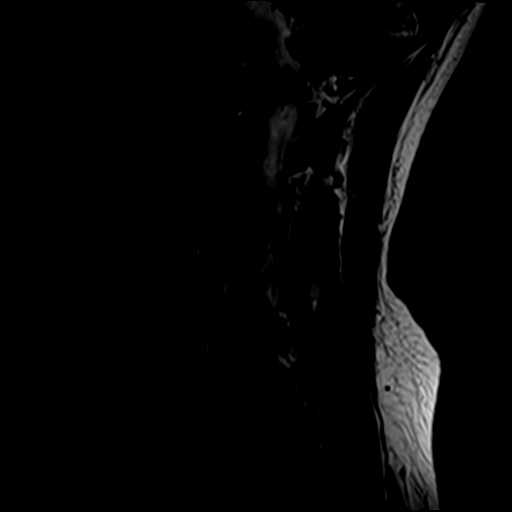
[im 7/17]
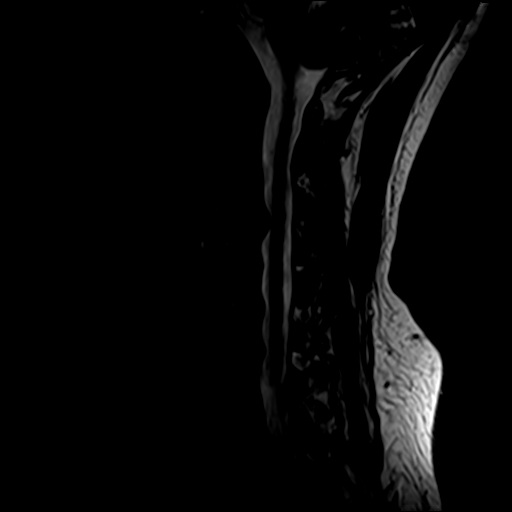
[im 9/17]
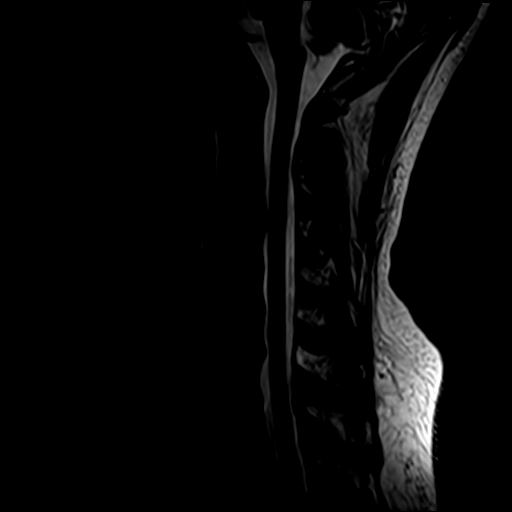
[im 11/17]
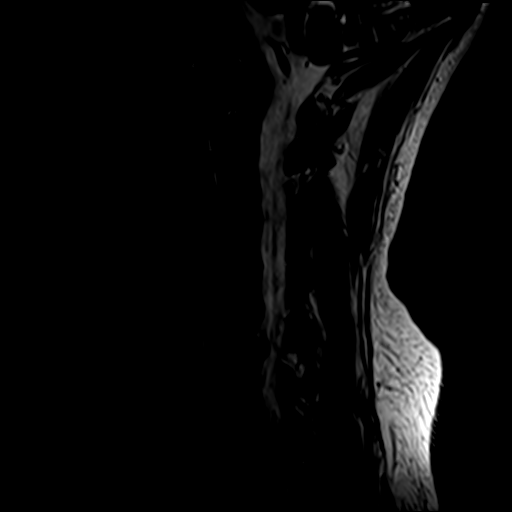
[im 13/17]
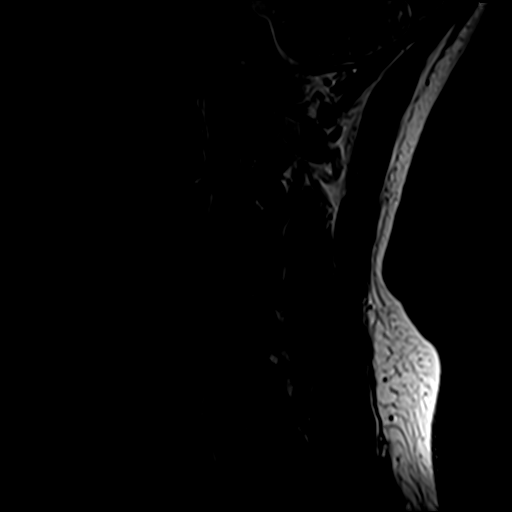
[im 15/17]
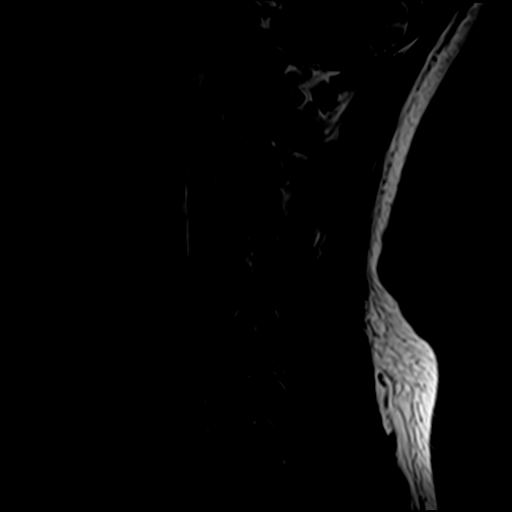
[im 17/17]
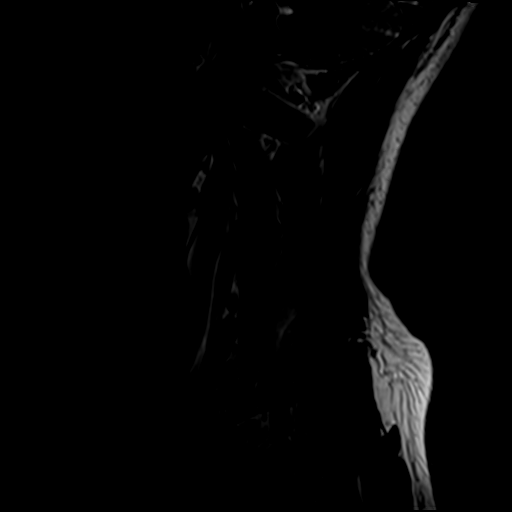

[Series 3: STIR · sagittal · 3.0mm · 0.82mm/px · 9 of 17 slices shown]
[im 1/17]
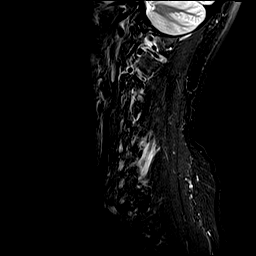
[im 3/17]
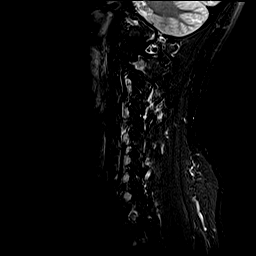
[im 5/17]
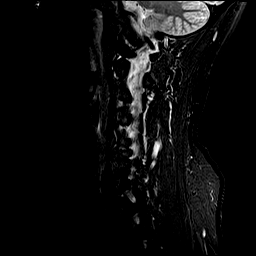
[im 7/17]
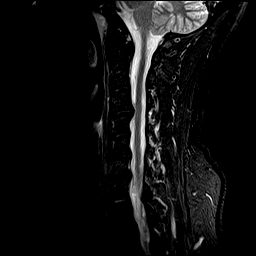
[im 9/17]
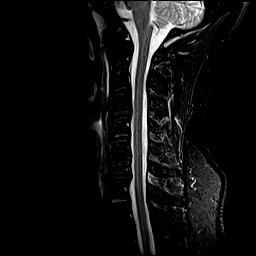
[im 11/17]
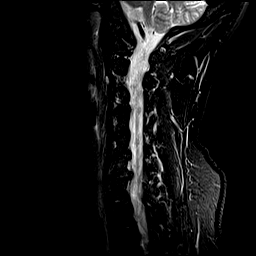
[im 13/17]
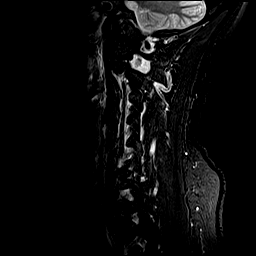
[im 15/17]
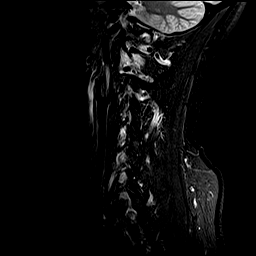
[im 17/17]
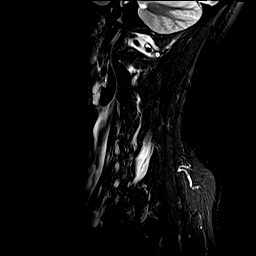

[Series 4: T1 · sagittal · 3.0mm · 0.82mm/px · 9 of 17 slices shown]
[im 1/17]
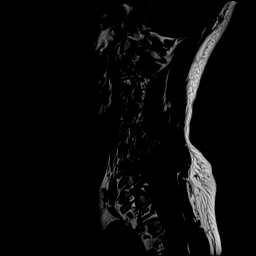
[im 3/17]
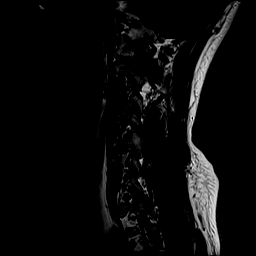
[im 5/17]
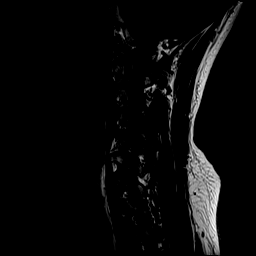
[im 7/17]
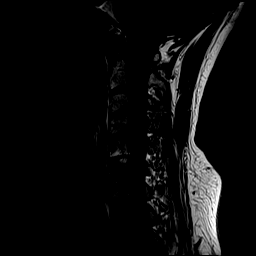
[im 9/17]
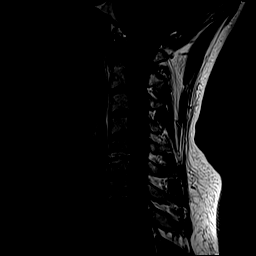
[im 11/17]
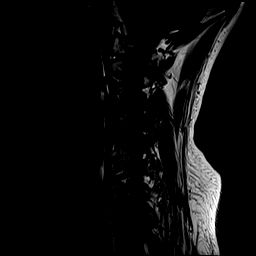
[im 13/17]
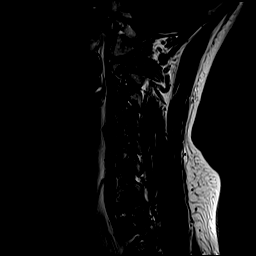
[im 15/17]
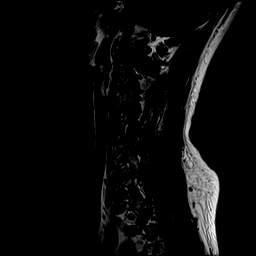
[im 17/17]
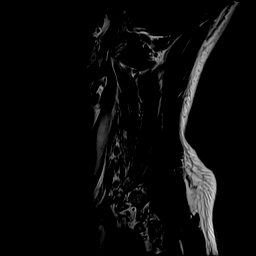

[Series 5: T2 · axial · 3.0mm · 0.70mm/px · z∈[-45,+97]mm · 16 of 41 slices shown (2 of 2)]
[im 1/41]
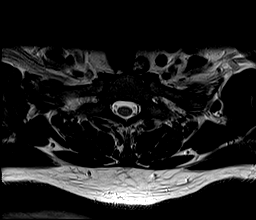
[im 3/41]
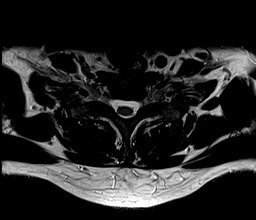
[im 5/41]
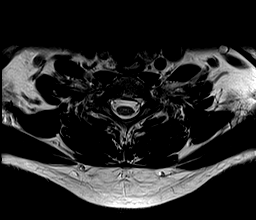
[im 7/41]
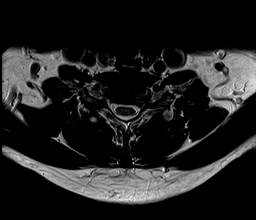
[im 9/41]
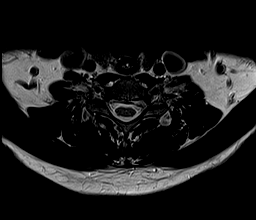
[im 11/41]
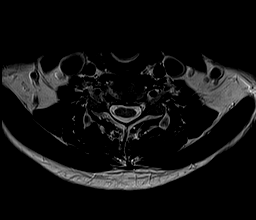
[im 13/41]
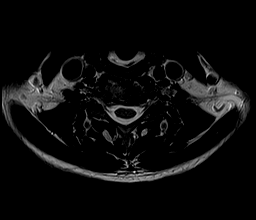
[im 15/41]
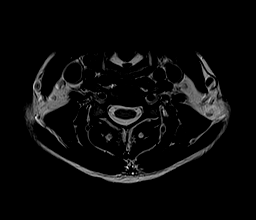
[im 17/41]
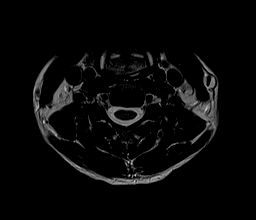
[im 19/41]
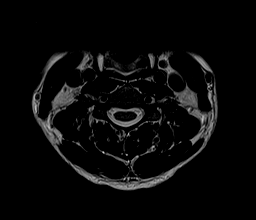
[im 21/41]
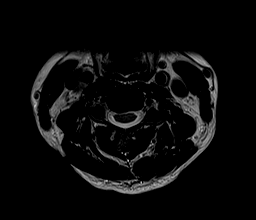
[im 23/41]
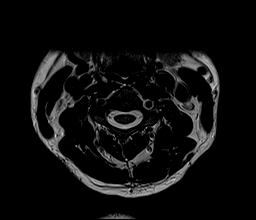
[im 25/41]
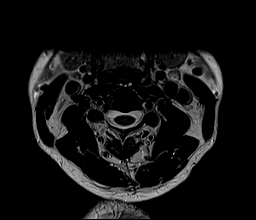
[im 29/41]
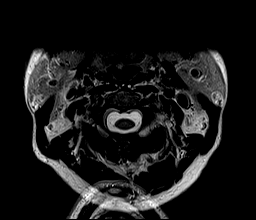
[im 35/41]
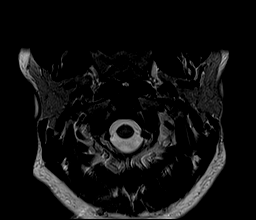
[im 39/41]
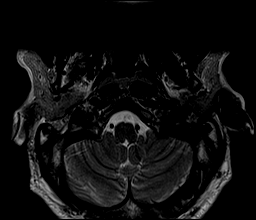

[43 of 48 positions shown; findings below may reference images not displayed]

FINDINGS: Alignment: Straightening of the normal cervical lordosis without
substantial sagittal subluxation.

Vertebrae: Degenerative/discogenic endplate signal changes about the
C5-C6 and to a lesser extent the posterior left C6-C7 discs. No
specific evidence of acute fracture, discitis/osteomyelitis, or
suspicious bone lesion. Vertebral body heights are maintained.

Cord: Normal cord signal.

Posterior Fossa, vertebral arteries, paraspinal tissues: Visualized
vertebral artery flow voids are maintained. Visualized inferior
posterior fossa is unremarkable on limited sagittal assessment.

Disc levels:

C2-C3: Small central disc/osteophyte without significant canal or
foraminal stenosis.

C3-C4: Small posterior disc osteophyte complex with superimposed
right paracentral disc protrusion which contacts and flattens the
right ventral cord without significant canal stenosis. Right greater
than left facet and uncovertebral hypertrophy without significant
foraminal stenosis.

C4-C5: Small posterior disc osteophyte complex and mild
uncovertebral hypertrophy without significant canal or foraminal
stenosis.

C5-C6: Degenerative disc height loss and desiccation. Posterior disc
osteophyte complex and right greater than left uncovertebral
hypertrophy. Resulting moderate right foraminal stenosis without
significant canal or left foraminal stenosis.

C6-C7: Degenerative disc height loss and desiccation. Posterior disc
osteophyte complex with bulky left greater than right uncovertebral
hypertrophy. Resulting severe left and moderate right foraminal
stenosis without significant canal stenosis.

C7-T1: No significant disc protrusion, foraminal stenosis, or canal
stenosis.
IMPRESSION: 1. Severe left foraminal stenosis at C6-C7. Moderate right foraminal
stenosis at C5-C6 and C6-C7.
2. At C3-C4 a right paracentral disc protrusion contacts and
flattens the right ventral cord without significant canal stenosis.

## 2023-12-11 ENCOUNTER — Other Ambulatory Visit (HOSPITAL_BASED_OUTPATIENT_CLINIC_OR_DEPARTMENT_OTHER): Payer: Self-pay | Admitting: Family Medicine

## 2023-12-11 DIAGNOSIS — Z8249 Family history of ischemic heart disease and other diseases of the circulatory system: Secondary | ICD-10-CM

## 2024-01-22 ENCOUNTER — Ambulatory Visit (HOSPITAL_BASED_OUTPATIENT_CLINIC_OR_DEPARTMENT_OTHER)
Admission: RE | Admit: 2024-01-22 | Discharge: 2024-01-22 | Disposition: A | Payer: Self-pay | Source: Ambulatory Visit | Attending: Family Medicine | Admitting: Family Medicine

## 2024-01-22 DIAGNOSIS — Z8249 Family history of ischemic heart disease and other diseases of the circulatory system: Secondary | ICD-10-CM

## 2024-02-19 ENCOUNTER — Other Ambulatory Visit: Payer: Self-pay

## 2024-02-19 ENCOUNTER — Emergency Department (HOSPITAL_BASED_OUTPATIENT_CLINIC_OR_DEPARTMENT_OTHER): Admitting: Radiology

## 2024-02-19 ENCOUNTER — Encounter (HOSPITAL_BASED_OUTPATIENT_CLINIC_OR_DEPARTMENT_OTHER): Payer: Self-pay

## 2024-02-19 ENCOUNTER — Emergency Department (HOSPITAL_BASED_OUTPATIENT_CLINIC_OR_DEPARTMENT_OTHER)
Admission: EM | Admit: 2024-02-19 | Discharge: 2024-02-19 | Disposition: A | Discharge status: Home / Self Care | Attending: Emergency Medicine | Admitting: Emergency Medicine

## 2024-02-19 DIAGNOSIS — Z23 Encounter for immunization: Secondary | ICD-10-CM | POA: Diagnosis not present

## 2024-02-19 DIAGNOSIS — S99921A Unspecified injury of right foot, initial encounter: Secondary | ICD-10-CM | POA: Diagnosis present

## 2024-02-19 DIAGNOSIS — S92534B Nondisplaced fracture of distal phalanx of right lesser toe(s), initial encounter for open fracture: Secondary | ICD-10-CM | POA: Insufficient documentation

## 2024-02-19 DIAGNOSIS — W208XXA Other cause of strike by thrown, projected or falling object, initial encounter: Secondary | ICD-10-CM | POA: Insufficient documentation

## 2024-02-19 MED ORDER — CEPHALEXIN 500 MG PO CAPS: 500.0000 mg | ORAL_CAPSULE | Freq: Two times a day (BID) | ORAL | 0 refills | Status: AC

## 2024-02-19 MED ORDER — LIDOCAINE HCL (PF) 1 % IJ SOLN
5.0000 mL | Freq: Once | INTRAMUSCULAR | Status: AC
  Administered 2024-02-19: 5 mL
  Filled 2024-02-19: qty 5

## 2024-02-19 MED ORDER — TETANUS-DIPHTH-ACELL PERTUSSIS 5-2-15.5 LF-MCG/0.5 IM SUSP
0.5000 mL | Freq: Once | INTRAMUSCULAR | Status: AC
  Administered 2024-02-19: 0.5 mL via INTRAMUSCULAR
  Filled 2024-02-19: qty 0.5

## 2024-02-19 NOTE — ED Provider Triage Note (Addendum)
 Emergency Medicine Provider Triage Evaluation Note  Danny Baker , a 48 y.o. male  was evaluated in triage.  Pt complains of laceration to right toes, dropped a plate on toes today, went to PCP walk in clinic and sent to ER. Unsure last td Review of Systems  Positive:  Negative:   Physical Exam  There were no vitals taken for this visit. Gen:   Awake, no distress   Resp:  Normal effort  MSK:   Laceration to right 3rd and 2nd toes, sensation intact distal to the lac, 3rd toe with mild oozing.  Other:    Medical Decision Making  Medically screening exam initiated at 4:41 PM.  Appropriate orders placed.  Danny Baker was informed that the remainder of the evaluation will be completed by another provider, this initial triage assessment does not replace that evaluation, and the importance of remaining in the ED until their evaluation is complete.     Beverley Leita LABOR, PA-C 02/19/24 1645    Beverley Leita LABOR, PA-C 02/19/24 1646

## 2024-02-19 NOTE — ED Provider Notes (Signed)
.  Laceration Repair  Date/Time: 02/19/2024 9:18 PM  Performed by: Beverley Leita LABOR, PA-C Authorized by: Beverley Leita LABOR, PA-C   Consent:    Consent obtained:  Verbal   Consent given by:  Patient   Risks, benefits, and alternatives were discussed: yes     Risks discussed:  Infection, pain, poor cosmetic result and poor wound healing   Alternatives discussed:  No treatment Universal protocol:    Patient identity confirmed:  Verbally with patient Anesthesia:    Anesthesia method:  Nerve block   Block needle gauge:  25 G   Block anesthetic:  Lidocaine  1% w/o epi   Block injection procedure:  Anatomic landmarks identified, anatomic landmarks palpated, introduced needle and incremental injection   Block outcome:  Anesthesia achieved Laceration details:    Location:  Toe   Toe location:  R third toe   Length (cm):  1   Depth (mm):  5 Pre-procedure details:    Preparation:  Patient was prepped and draped in usual sterile fashion and imaging obtained to evaluate for foreign bodies Exploration:    Imaging obtained: x-ray     Imaging outcome: foreign body not noted     Wound exploration: wound explored through full range of motion and entire depth of wound visualized     Wound extent: underlying fracture     Contaminated: no   Treatment:    Area cleansed with:  Saline   Amount of cleaning:  Extensive   Irrigation solution:  Sterile saline   Irrigation method:  Pressure wash   Debridement:  None   Undermining:  None Skin repair:    Repair method:  Sutures   Suture size:  4-0   Suture material:  Nylon   Suture technique:  Simple interrupted   Number of sutures:  4 Approximation:    Approximation:  Close Repair type:    Repair type:  Simple Post-procedure details:    Dressing:  Open (no dressing)   Procedure completion:  Tolerated     Beverley Leita LABOR, PA-C 02/19/24 2120    Doretha Folks, MD 02/20/24 1519

## 2024-02-19 NOTE — ED Provider Notes (Signed)
 " [facility] AT [facility] Provider Note   CSN: [id number] Arrival date & time: 02/19/24  1629     Patient presents with: Foot Problem (right) and Laceration   Danny Baker is a 49 y.o. male.   Patient is a 49 year old male presenting today after an injury to his right third toe.  He was getting something out of the refrigerator when a plate slid out and landed directly on his toe.  He has had a laceration, pain and bruising to this toe since.  Uncertain when his last tetanus shot was.  The history is provided by the patient.  Laceration      Prior to Admission medications  Medication Sig Start Date End Date Taking? Authorizing Provider  cephALEXin  (KEFLEX ) 500 MG capsule Take 1 capsule (500 mg total) by mouth 2 (two) times daily. 02/19/24  Yes Doretha Folks, MD  naproxen  (NAPROSYN ) 500 MG tablet Take 1 tablet (500 mg total) by mouth 2 (two) times daily. 09/05/12   Raford Lenis, MD  oxyCODONE  (OXY IR/ROXICODONE ) 5 MG immediate release tablet Take 1-2 tablets (5-10 mg total) by mouth every 6 (six) hours as needed for moderate pain, severe pain or breakthrough pain. 08/16/17   Vernetta Berg, MD    Allergies: Patient has no known allergies.    Review of Systems  Updated Vital Signs BP (!) 148/90 (BP Location: Right Arm)   Pulse 81   Temp 98.3 F (36.8 C) (Oral)   Resp 18   Ht 6' (1.829 m)   Wt 81.6 kg   SpO2 100%   BMI 24.41 kg/m   Physical Exam Vitals and nursing note reviewed.  Constitutional:      General: He is not in acute distress.    Appearance: He is well-developed.  HENT:     Head: Normocephalic and atraumatic.  Eyes:     Conjunctiva/sclera: Conjunctivae normal.     Pupils: Pupils are equal, round, and reactive to light.  Cardiovascular:     Rate and Rhythm: Normal rate and regular rhythm.  Pulmonary:     Effort: Pulmonary effort is normal.  Musculoskeletal:        General: Tenderness and signs of injury present.  Normal range of motion.     Cervical back: Normal range of motion and neck supple.     Comments: Small skin avulsion of the right second toe, laceration with avulsion of the nail of the third toe  Skin:    General: Skin is warm and dry.     Findings: No erythema or rash.  Neurological:     Mental Status: He is alert and oriented to person, place, and time.  Psychiatric:        Behavior: Behavior normal.     (all labs ordered are listed, but only abnormal results are displayed) Labs Reviewed - No data to display  EKG: None  Radiology: DG Foot Complete Right Result Date: 02/19/2024 CLINICAL DATA:  Toe lacerations after dropping a plate on his foot. EXAM: RIGHT FOOT COMPLETE - 3+ VIEW COMPARISON:  None Available. FINDINGS: A nondisplaced fracture deformity is seen involving the tuft of the distal phalanx of the third right toe. This is limited in evaluation secondary to mildly radiopaque overlying gauze. There is no evidence of arthropathy or other focal bone abnormality. Soft tissues are unremarkable. IMPRESSION: Nondisplaced fracture of the distal phalanx of the third right toe. Electronically Signed   By: Suzen Dials M.D.   On: 02/19/2024 18:26  Procedures   Medications Ordered in the ED  lidocaine  (PF) (XYLOCAINE ) 1 % injection 5 mL (5 mLs Infiltration Given 02/19/24 2143)  Tdap (ADACEL ) injection 0.5 mL (0.5 mLs Intramuscular Given 02/19/24 2144)                                    Medical Decision Making Amount and/or Complexity of Data Reviewed Radiology: ordered and independent interpretation performed. Decision-making details documented in ED Course.  Risk Prescription drug management.   Patient presenting after an injury to his right third toe.  Nailbed is avulsed.  I have independently visualized and interpreted pt's images today.  X-ray with evidence of a nondisplaced fracture of the distal phalanx.  No other signs of injury.  Wound was repaired as documented.   Patient was placed in a postop shoe.  Was given antibiotics due to fracture and wound in the same area.      Final diagnoses:  Open nondisplaced fracture of distal phalanx of lesser toe of right foot, initial encounter    ED Discharge Orders          Ordered    cephALEXin  (KEFLEX ) 500 MG capsule  2 times daily        02/19/24 2035               Doretha Folks, MD 02/19/24 2323  "

## 2024-02-19 NOTE — ED Triage Notes (Signed)
 Arrives POV with complaints of lacerating two of his toes on his right foot today. Patient reports that he dropped a plate on his foot, lacerating his toes. Sent here from [facility] for further eval

## 2024-02-19 NOTE — Discharge Instructions (Addendum)
 You can take Tylenol  or ibuprofen as needed for pain.  The stitches need to come out in 1 week and if for some reason your doctor is unable to see you you can always follow-up with the emergency room or urgent care.  Wear comfortable shoe to allow the area to heal.  It is okay to wash gently with soap and water but do not soak underwater.  You will take 1 week of antibiotics to prevent infection.
